# Patient Record
Sex: Female | Born: 1975 | Race: White | Hispanic: No | Marital: Married | Smoking: Never smoker
Health system: Southern US, Community
[De-identification: ages and names within clinical notes are randomized; demographics above are authoritative.]

## PROBLEM LIST (undated history)

## (undated) HISTORY — PX: CHOLECYSTECTOMY: SHX55

## (undated) HISTORY — PX: ABDOMINAL HYSTERECTOMY: SHX81

---

## 2013-06-26 ENCOUNTER — Other Ambulatory Visit: Payer: Self-pay | Admitting: Gastroenterology

## 2013-06-26 DIAGNOSIS — R109 Unspecified abdominal pain: Secondary | ICD-10-CM

## 2013-06-29 ENCOUNTER — Other Ambulatory Visit: Payer: Self-pay

## 2013-07-16 ENCOUNTER — Other Ambulatory Visit: Payer: Self-pay

## 2013-07-30 ENCOUNTER — Ambulatory Visit
Admission: RE | Admit: 2013-07-30 | Discharge: 2013-07-30 | Disposition: A | Discharge status: Home / Self Care | Payer: BC Managed Care – PPO | Source: Ambulatory Visit | Attending: Gastroenterology | Admitting: Gastroenterology

## 2013-07-30 DIAGNOSIS — R109 Unspecified abdominal pain: Secondary | ICD-10-CM

## 2015-10-30 IMAGING — US US ABDOMEN COMPLETE
1 series · 14 of 25 positions shown · non-contrast
Comparison: None.

CLINICAL DATA: Abdominal pain

EXAM:
ULTRASOUND ABDOMEN COMPLETE

[Series 1: us abdomen complete · 0.26mm/px · 14 of 60 slices shown]
[im 1/60]
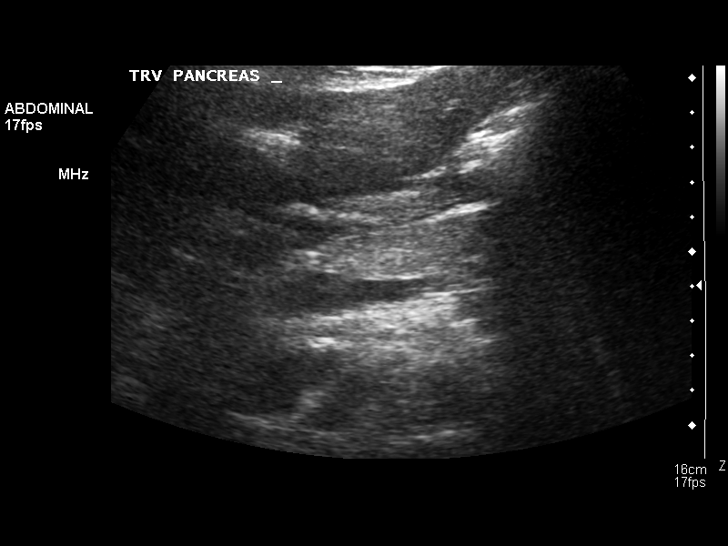
[im 5/60]
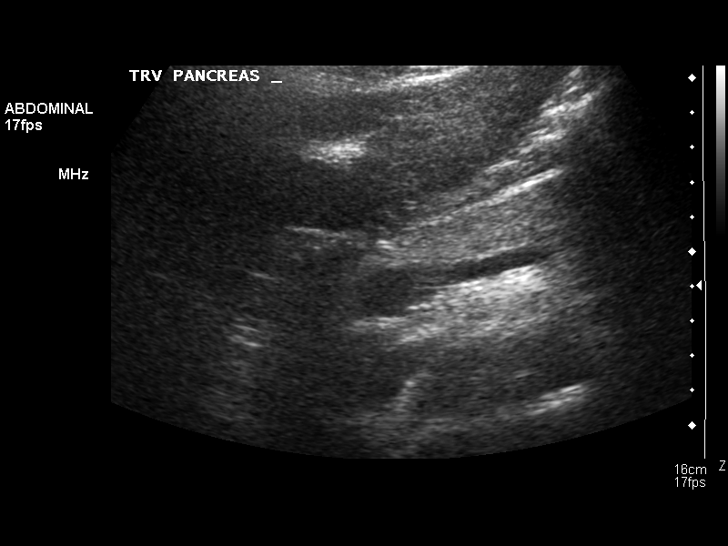
[im 10/60]
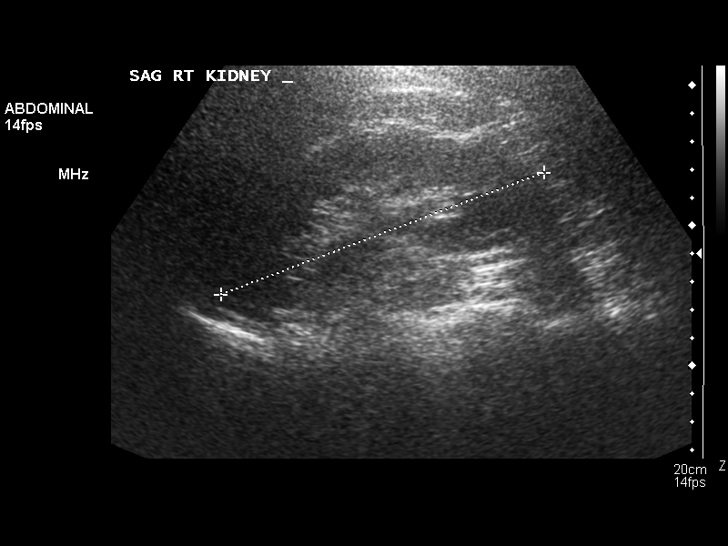
[im 15/60]
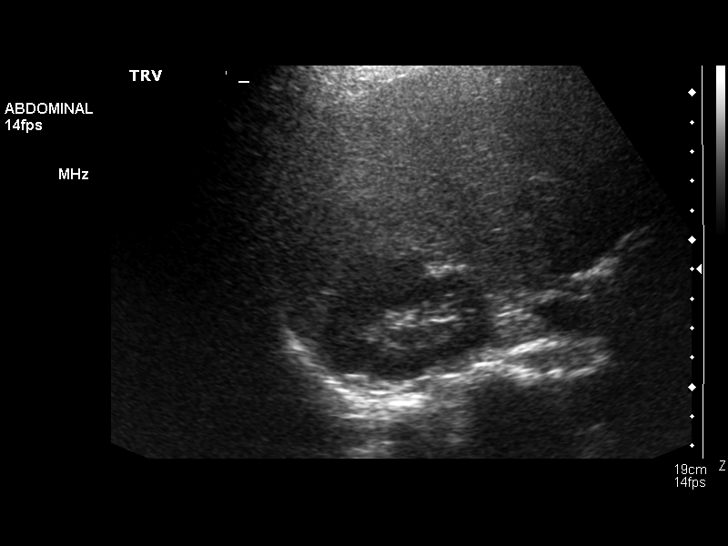
[im 20/60]
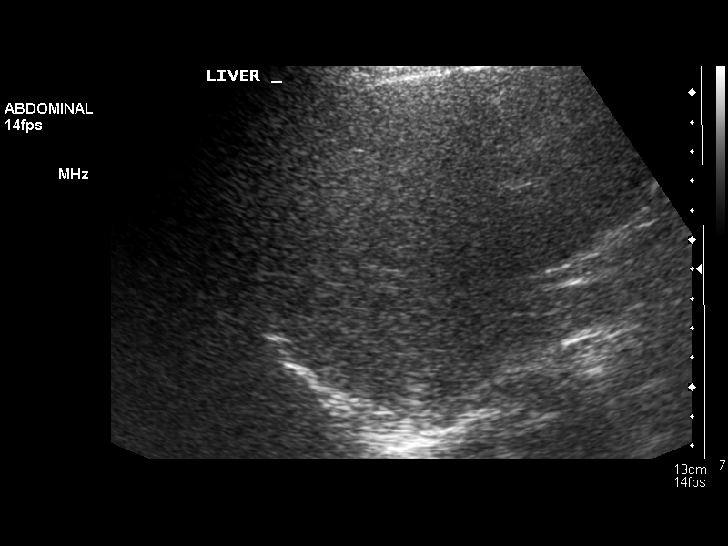
[im 23/60]
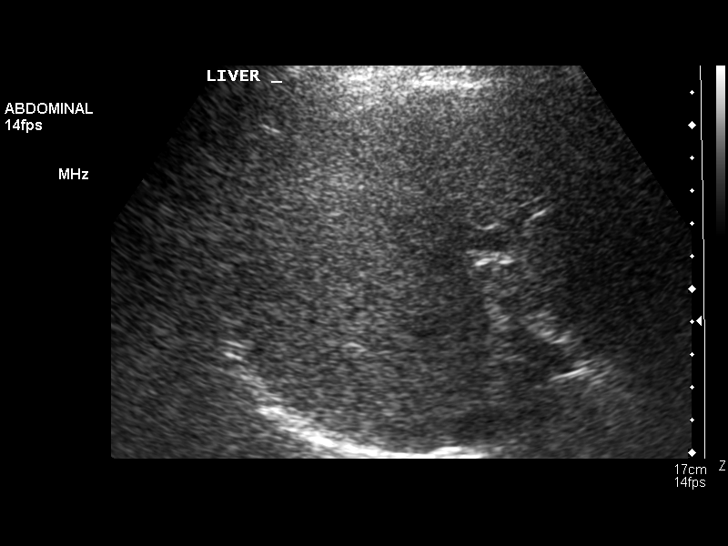
[im 28/60]
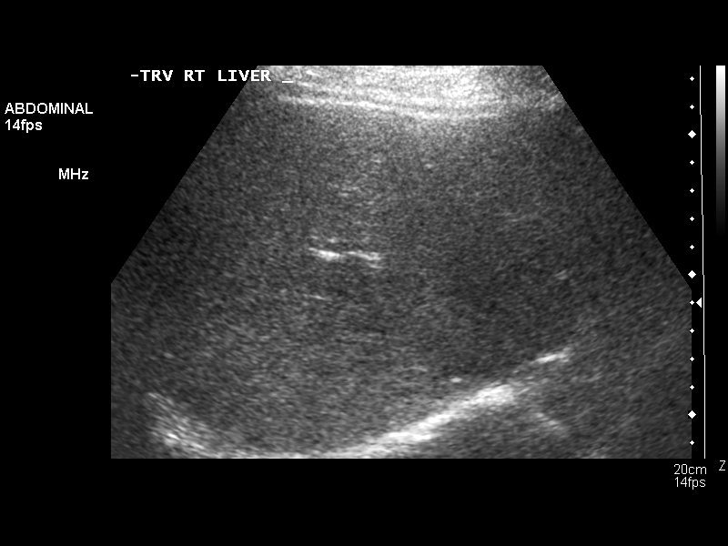
[im 32/60]
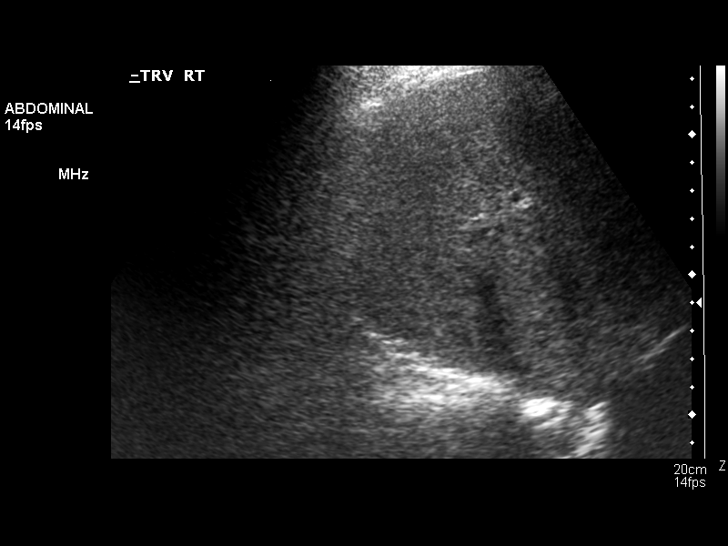
[im 37/60]
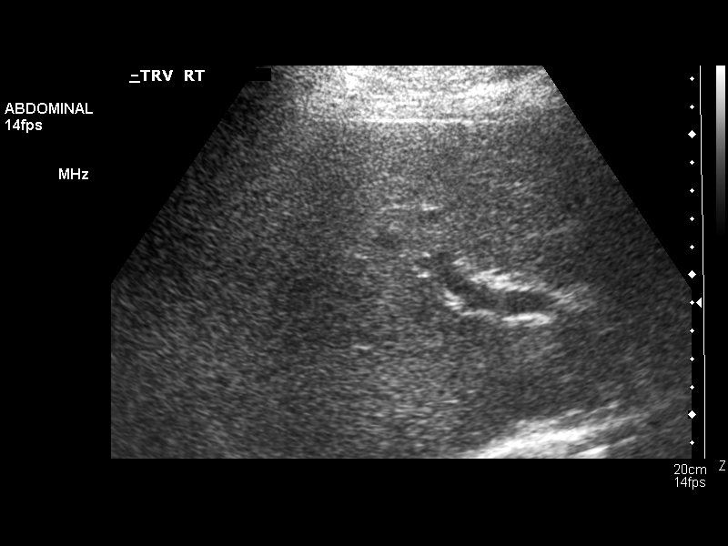
[im 40/60]
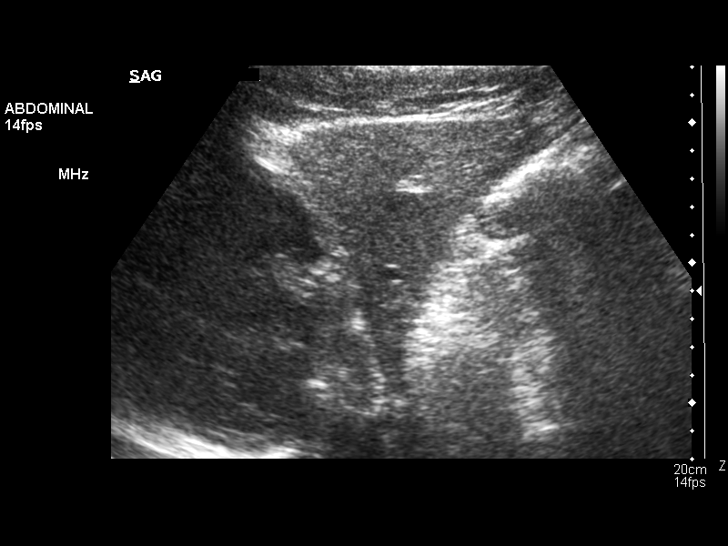
[im 45/60]
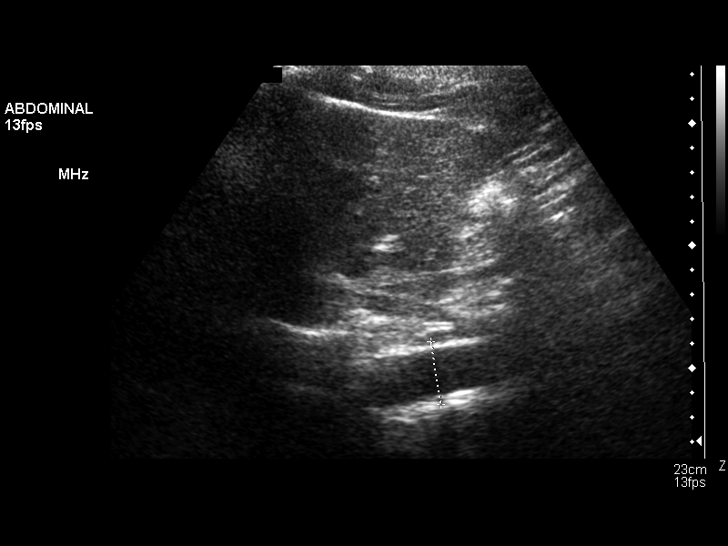
[im 50/60]
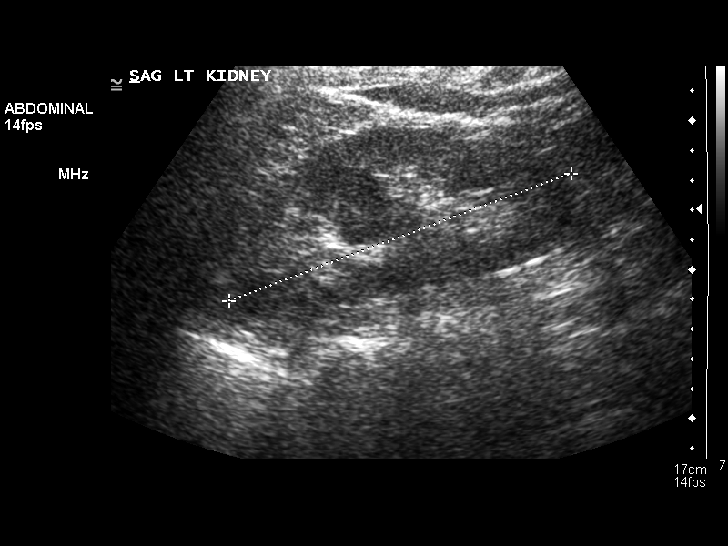
[im 55/60]
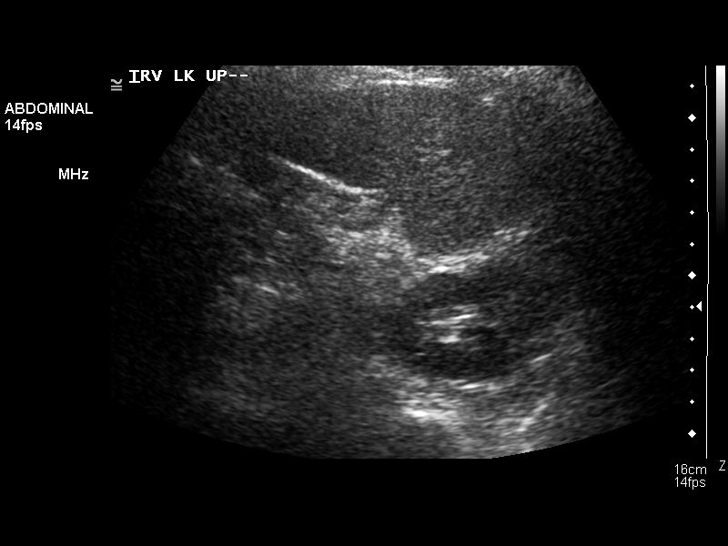
[im 60/60]
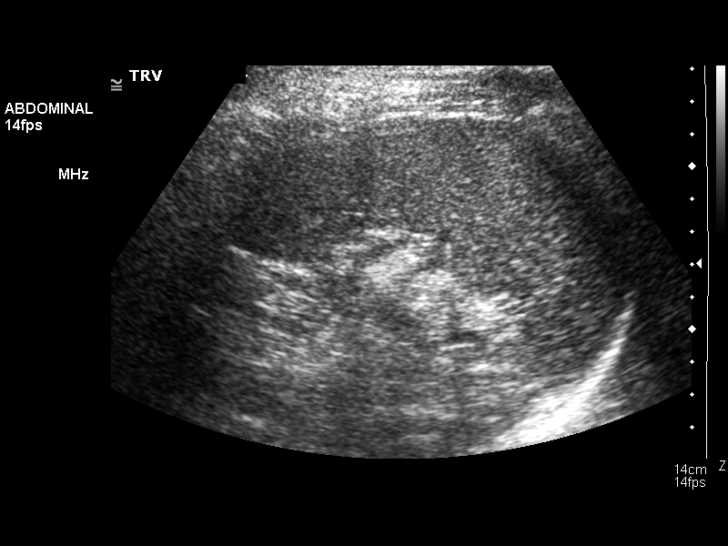

[14 of 25 positions shown; findings below may reference images not displayed]

FINDINGS: Gallbladder:

Surgically absent gallbladder

Common bile duct:

Diameter: 3.4 mm

Liver:

Echogenic liver consistent with fatty infiltration. No focal liver
mass lesion

IVC:

No abnormality visualized.

Pancreas:

Visualized portion unremarkable.

Spleen:

Size and appearance within normal limits.

Right Kidney:

Length: 12.3 cm. Echogenicity within normal limits. No mass or
hydronephrosis visualized.

Left Kidney:

Length: 12.3 cm. Echogenicity within normal limits. No mass or
hydronephrosis visualized.

Abdominal aorta:

No aneurysm visualized.

Other findings:

None.
IMPRESSION: Fatty infiltration of the liver. Surgically absent gallbladder. No
acute abnormality.

## 2016-04-08 ENCOUNTER — Institutional Professional Consult (permissible substitution): Payer: BC Managed Care – PPO | Admitting: Neurology

## 2020-05-08 ENCOUNTER — Other Ambulatory Visit: Payer: Self-pay

## 2020-05-08 ENCOUNTER — Encounter (HOSPITAL_BASED_OUTPATIENT_CLINIC_OR_DEPARTMENT_OTHER): Payer: Self-pay | Admitting: Emergency Medicine

## 2020-05-08 ENCOUNTER — Emergency Department (HOSPITAL_BASED_OUTPATIENT_CLINIC_OR_DEPARTMENT_OTHER): Payer: BC Managed Care – PPO

## 2020-05-08 ENCOUNTER — Emergency Department (HOSPITAL_BASED_OUTPATIENT_CLINIC_OR_DEPARTMENT_OTHER)
Admission: EM | Admit: 2020-05-08 | Discharge: 2020-05-09 | Disposition: A | Discharge status: Home / Self Care | Payer: BC Managed Care – PPO | Attending: Emergency Medicine | Admitting: Emergency Medicine

## 2020-05-08 DIAGNOSIS — K56600 Partial intestinal obstruction, unspecified as to cause: Secondary | ICD-10-CM

## 2020-05-08 DIAGNOSIS — R1010 Upper abdominal pain, unspecified: Secondary | ICD-10-CM | POA: Diagnosis present

## 2020-05-08 DIAGNOSIS — K56699 Other intestinal obstruction unspecified as to partial versus complete obstruction: Secondary | ICD-10-CM | POA: Diagnosis not present

## 2020-05-08 LAB — CBC
HCT: 42.4 % (ref 36.0–46.0)
Hemoglobin: 14 g/dL (ref 12.0–15.0)
MCH: 29.2 pg (ref 26.0–34.0)
MCHC: 33 g/dL (ref 30.0–36.0)
MCV: 88.3 fL (ref 80.0–100.0)
Platelets: 318 10*3/uL (ref 150–400)
RBC: 4.8 MIL/uL (ref 3.87–5.11)
RDW: 13 % (ref 11.5–15.5)
WBC: 14.6 10*3/uL — ABNORMAL HIGH (ref 4.0–10.5)
nRBC: 0 % (ref 0.0–0.2)

## 2020-05-08 LAB — COMPREHENSIVE METABOLIC PANEL
ALT: 22 U/L (ref 0–44)
AST: 22 U/L (ref 15–41)
Albumin: 4.3 g/dL (ref 3.5–5.0)
Alkaline Phosphatase: 64 U/L (ref 38–126)
Anion gap: 10 (ref 5–15)
BUN: 10 mg/dL (ref 6–20)
CO2: 25 mmol/L (ref 22–32)
Calcium: 9.5 mg/dL (ref 8.9–10.3)
Chloride: 104 mmol/L (ref 98–111)
Creatinine, Ser: 0.69 mg/dL (ref 0.44–1.00)
GFR, Estimated: >60 mL/min (ref >60)
Glucose, Bld: 115 mg/dL — ABNORMAL HIGH (ref 70–99)
Potassium: 3.7 mmol/L (ref 3.5–5.1)
Sodium: 139 mmol/L (ref 135–145)
Total Bilirubin: 1.2 mg/dL (ref 0.3–1.2)
Total Protein: 8 g/dL (ref 6.5–8.1)

## 2020-05-08 LAB — URINALYSIS, ROUTINE W REFLEX MICROSCOPIC
Bilirubin Urine: NEGATIVE
Glucose, UA: NEGATIVE mg/dL
Hgb urine dipstick: NEGATIVE
Ketones, ur: NEGATIVE mg/dL
Leukocytes,Ua: NEGATIVE
Nitrite: NEGATIVE
Protein, ur: NEGATIVE mg/dL
Specific Gravity, Urine: >1.03 — ABNORMAL HIGH (ref 1.005–1.030)
pH: 6.5 (ref 5.0–8.0)

## 2020-05-08 LAB — LIPASE, BLOOD: Lipase: 37 U/L (ref 11–51)

## 2020-05-08 LAB — LACTIC ACID, PLASMA: Lactic Acid, Venous: 0.9 mmol/L (ref 0.5–1.9)

## 2020-05-08 MED ORDER — MORPHINE SULFATE (PF) 4 MG/ML IV SOLN
4.0000 mg | Freq: Once | INTRAVENOUS | Status: DC
  Filled 2020-05-08: qty 1

## 2020-05-08 MED ORDER — FAMOTIDINE 20 MG PO TABS
40.0000 mg | ORAL_TABLET | Freq: Once | ORAL | Status: AC
  Administered 2020-05-08: 40 mg via ORAL
  Filled 2020-05-08: qty 2

## 2020-05-08 MED ORDER — ONDANSETRON 4 MG PO TBDP
4.0000 mg | ORAL_TABLET | Freq: Once | ORAL | Status: AC
  Administered 2020-05-08: 4 mg via ORAL
  Filled 2020-05-08: qty 1

## 2020-05-08 MED ORDER — ALUM & MAG HYDROXIDE-SIMETH 200-200-20 MG/5ML PO SUSP
30.0000 mL | Freq: Once | ORAL | Status: AC
  Administered 2020-05-08: 30 mL via ORAL
  Filled 2020-05-08: qty 30

## 2020-05-08 MED ORDER — LACTATED RINGERS IV BOLUS
1000.0000 mL | Freq: Once | INTRAVENOUS | Status: AC
  Administered 2020-05-08: 1000 mL via INTRAVENOUS

## 2020-05-08 MED ORDER — IOHEXOL 300 MG/ML  SOLN
100.0000 mL | Freq: Once | INTRAMUSCULAR | Status: AC | PRN
  Administered 2020-05-08: 100 mL via INTRAVENOUS

## 2020-05-08 NOTE — ED Notes (Signed)
Patient transported to CT 

## 2020-05-08 NOTE — ED Triage Notes (Addendum)
Upper abd pain for N/V for several days. Pt take wegovy for weight loss and states this could be a side effect.

## 2020-05-08 NOTE — ED Provider Notes (Addendum)
[facility] EMERGENCY DEPARTMENT Provider Note   CSN: [id number] Arrival date & time: 05/08/20  1720     History Chief Complaint  Patient presents with  . Abdominal Pain    Ashley Nixon is a 44 y.o. female.  HPI Patient reports she is taking Wegovy for weight loss.  She started developing abdominal pain yesterday.  She reports she has pain in her upper abdomen.  It feels like a "stomachache".  She reports that just seemed to get worse over the course of today.  She has now become very nauseated.  She reports earlier today the pain was nearly doubling her over.  She reports that slightly improved but still very uncomfortable.  No significant lower abdominal pain.  Patient denies she has not had diarrhea or pain or burning urgency with urination.  Patient reports she has had a cholecystectomy.  She is also had a hysterectomy.  Patient reports that she tried some Gas-X today without relief.  He does not typically take PPI/antacids.    History reviewed. No pertinent past medical history.  There are no problems to display for this patient.   Past Surgical History:  Procedure Laterality Date  . ABDOMINAL HYSTERECTOMY    . CESAREAN SECTION    . CHOLECYSTECTOMY       OB History   No obstetric history on file.     No family history on file.  Social History   Tobacco Use  . Smoking status: Never Smoker  . Smokeless tobacco: Never Used  Substance Use Topics  . Alcohol use: Not Currently    Home Medications Prior to Admission medications   Medication Sig Start Date End Date Taking? Authorizing Provider  famotidine (PEPCID) 20 MG tablet Take 1 tablet (20 mg total) by mouth 2 (two) times daily. 05/09/20   Molpus, Jonny Ruiz, MD  ondansetron (ZOFRAN ODT) 8 MG disintegrating tablet Take 1 tablet (8 mg total) by mouth every 8 (eight) hours as needed for nausea or vomiting. 05/09/20   Molpus, John, MD    Allergies    Sulfa antibiotics  Review of Systems   Review of  Systems 10 systems reviewed and negative except as per HPI Physical Exam Updated Vital Signs BP (!) 150/78   Pulse 74   Temp 98 F (36.7 C)   Resp 18   Ht 5\' 8"  (1.727 m)   Wt (!) 138.8 kg   SpO2 99%   BMI 46.53 kg/m   Physical Exam Constitutional:      Appearance: She is well-developed and well-nourished.     Comments: Patient is alert and nontoxic. Clinically well in appearance. No respiratory distress.  HENT:     Head: Normocephalic and atraumatic.     Mouth/Throat:     Pharynx: Oropharynx is clear.  Eyes:     Extraocular Movements: Extraocular movements intact and EOM normal.     Conjunctiva/sclera: Conjunctivae normal.  Cardiovascular:     Rate and Rhythm: Normal rate and regular rhythm.     Pulses: Intact distal pulses.     Heart sounds: Normal heart sounds.  Pulmonary:     Effort: Pulmonary effort is normal.     Breath sounds: Normal breath sounds.  Abdominal:     General: Bowel sounds are normal. There is no distension.     Palpations: Abdomen is soft.     Tenderness: There is abdominal tenderness.     Comments: Patient has mild tenderness in the epigastrium and left upper quadrant. No guarding. Some central  discomfort to palpation around the umbilicus. No hernia or umbilical mass. Lower abdomen nontender.  Musculoskeletal:        General: No edema. Normal range of motion.     Cervical back: Neck supple.  Skin:    General: Skin is warm, dry and intact.  Neurological:     Mental Status: She is alert and oriented to person, place, and time.     GCS: GCS eye subscore is 4. GCS verbal subscore is 5. GCS motor subscore is 6.     Coordination: Coordination normal.     Deep Tendon Reflexes: Strength normal.  Psychiatric:        Mood and Affect: Mood and affect normal.     ED Results / Procedures / Treatments   Labs (all labs ordered are listed, but only abnormal results are displayed) Labs Reviewed  COMPREHENSIVE METABOLIC PANEL - Abnormal; Notable for the  following components:      Result Value   Glucose, Bld 115 (*)    All other components within normal limits  CBC - Abnormal; Notable for the following components:   WBC 14.6 (*)    All other components within normal limits  URINALYSIS, ROUTINE W REFLEX MICROSCOPIC - Abnormal; Notable for the following components:   APPearance CLOUDY (*)    Specific Gravity, Urine >1.030 (*)    All other components within normal limits  CULTURE, BLOOD (ROUTINE X 2)  CULTURE, BLOOD (ROUTINE X 2)  LIPASE, BLOOD  LACTIC ACID, PLASMA    EKG None  Radiology CT Abdomen Pelvis W Contrast  Result Date: 05/08/2020 CLINICAL DATA:  Upper abdominal pain and nausea for several days. Weight loss. EXAM: CT ABDOMEN AND PELVIS WITH CONTRAST TECHNIQUE: Multidetector CT imaging of the abdomen and pelvis was performed using the standard protocol following bolus administration of intravenous contrast. CONTRAST:  OMNIPAQUE IOHEXOL 300 MG/ML  SOLN COMPARISON:  CT abdomen pelvis 04/01/2017 FINDINGS: Lower chest: No acute abnormality. Hepatobiliary: Redemonstration of a similar-appearing right hepatic lobe 2.7 x 3.2 cm hypodense lesion (2:6). Vague hypodensity along the false form ligament likely represents focal fatty focal tray shin. Otherwise no new focal liver abnormality is seen. Status post cholecystectomy. No biliary dilatation. Pancreas: No focal lesion. Normal pancreatic contour. No surrounding inflammatory changes. No main pancreatic ductal dilatation. Spleen: Normal in size without focal abnormality. Adrenals/Urinary Tract: No adrenal nodule bilaterally. Bilateral kidneys enhance symmetrically. No hydronephrosis. No hydroureter. The urinary bladder is unremarkable. Stomach/Bowel: Stomach is within normal limits. Couple loops of left upper and mid abdominal small bowel are minimally distended with fluid and measure up to 3.9 cm. Associated mild fecalization sign is noted. No pneumatosis. No definite transition point. No  small bowel wall thickening. No evidence of large bowel wall thickening or dilatation. Scattered colonic diverticulosis. The appendix not definitely visualized. Vascular/Lymphatic: No abdominal aorta or iliac aneurysm. No abdominal, pelvic, or inguinal lymphadenopathy. Reproductive: Status post hysterectomy. The right ovary/adnexal region are unremarkable. There is a 5.1 x 2.8 cm fluid density lesion that is associated with the left ovary/adnexal region. Other: No intraperitoneal free fluid. No intraperitoneal free gas. No organized fluid collection. Musculoskeletal: No abdominal wall hernia or abnormality No suspicious lytic or blastic osseous lesions. No acute displaced fracture. Multilevel degenerative changes of the spine. IMPRESSION: 1. Findings suggestive of a early/partial small bowel obstruction. 2. A 5.1 cm left ovarian cystic lesion. Because this lesion is not adequately characterized, prompt [place] is recommended for further evaluation. Note: This recommendation does not apply to premenarchal  patients and to those with increased risk (genetic, family history, elevated tumor markers or other high-risk factors) of ovarian cancer. Reference: JACR 2020 Feb; 17(2):248-254 3. Scattered colonic diverticulosis with no acute diverticulitis. Electronically Signed   By: Tish Frederickson M.D.   On: 05/08/2020 22:55   DG Abd Acute W/Chest  Result Date: 05/08/2020 CLINICAL DATA:  Upper abdominal pain with nausea and vomiting EXAM: DG ABDOMEN ACUTE WITH 1 VIEW CHEST COMPARISON:  CT 04/01/2017 FINDINGS: Single-view chest demonstrates no focal opacity or pleural effusion. Normal cardiomediastinal silhouette. Supine and upright views of the abdomen demonstrate no free air beneath the diaphragm. Multiple dilated loops of small bowel measuring up to 4.4 cm with fluid levels, though there is colon gas present, findings are concerning for a small bowel obstruction. IMPRESSION: Findings concerning for a small bowel obstruction.  Consider CT for further evaluation. Electronically Signed   By: Jasmine Pang M.D.   On: 05/08/2020 20:52    Procedures Procedures (including critical care time)  Medications Ordered in ED Medications  famotidine (PEPCID) tablet 40 mg (40 mg Oral Given 05/08/20 2051)  alum & mag hydroxide-simeth (MAALOX/MYLANTA) 200-200-20 MG/5ML suspension 30 mL (30 mLs Oral Given 05/08/20 2051)  ondansetron (ZOFRAN-ODT) disintegrating tablet 4 mg (4 mg Oral Given 05/08/20 2051)  lactated ringers bolus 1,000 mL (0 mLs Intravenous Stopped 05/08/20 2244)  iohexol (OMNIPAQUE) 300 MG/ML solution 100 mL (100 mLs Intravenous Contrast Given 05/08/20 2226)    ED Course  I have reviewed the triage vital signs and the nursing notes.  Pertinent labs & imaging results that were available during my care of the patient were reviewed by me and considered in my medical decision making (see chart for details).    MDM Rules/Calculators/A&P                          Patient presents with upper abdominal pain as outlined. She had recently started to [facility] for weight loss and thought this could be the reason for her new onset of abdominal pain. Patient is clinically well without surgical abdomen. Acute abdominal series suggested possible early small bowel obstruction. At that time patient had been given Pepcid, zofran  and GI cocktail. She reported feeling fairly significant improvement for that intervention. Due to findings on plain film x-ray, CT scan obtained. CT illustrated similar finding of early or possible partial SBO. I explained the finding to the patient and possible etiologies including adhesions from prior gallbladder surgery. I had recommended consultation for hospitalist admission for observation and initiation of conservative management for small bowel obstruction. Patient has strong preference to go home and return for recheck. I did advise that any surgery overnight was very unlikely unless there was a dramatic  change. Patient's abdomen at time of discharge is soft and only mildly reproducible discomfort. Shared decision making for clear liquids overnight and return for recheck the following day, with caveat that if pain was significantly worsening or suddenly worsening, vomiting, fever or worsening illness developed, patient was to return immediately to the emergency department. Final Clinical Impression(s) / ED Diagnoses Final diagnoses:  Partial small bowel obstruction (HCC)    Rx / DC Orders ED Discharge Orders         Ordered    ondansetron (ZOFRAN ODT) 8 MG disintegrating tablet  Every 8 hours PRN        05/09/20 0018    famotidine (PEPCID) 20 MG tablet  2 times daily  05/09/20 0018           Arby BarrettePfeiffer, Brain Honeycutt, MD 05/09/20 1417    Arby BarrettePfeiffer, Gavynn Duvall, MD 05/09/20 1418

## 2020-05-09 MED ORDER — FAMOTIDINE 20 MG PO TABS: 20.0000 mg | ORAL_TABLET | Freq: Two times a day (BID) | ORAL | 0 refills | Status: AC

## 2020-05-09 MED ORDER — ONDANSETRON 8 MG PO TBDP: 8.0000 mg | ORAL_TABLET | Freq: Three times a day (TID) | ORAL | 0 refills | Status: AC | PRN

## 2020-05-09 NOTE — Discharge Instructions (Signed)
Clear liquids only, recheck tomorrow. Return immediately if you suddenly increasing pain, vomiting, or other worsening symptoms.

## 2020-05-14 LAB — CULTURE, BLOOD (ROUTINE X 2)
Culture: NO GROWTH
Culture: NO GROWTH
Special Requests: ADEQUATE

## 2020-05-22 ENCOUNTER — Ambulatory Visit
Admission: RE | Admit: 2020-05-22 | Discharge: 2020-05-22 | Disposition: A | Discharge status: Home / Self Care | Payer: BC Managed Care – PPO | Source: Ambulatory Visit | Attending: Physician Assistant | Admitting: Physician Assistant

## 2020-05-22 ENCOUNTER — Other Ambulatory Visit: Payer: Self-pay | Admitting: Physician Assistant

## 2020-05-22 DIAGNOSIS — K56600 Partial intestinal obstruction, unspecified as to cause: Secondary | ICD-10-CM

## 2022-02-02 ENCOUNTER — Other Ambulatory Visit: Payer: Self-pay | Admitting: Internal Medicine

## 2022-02-02 DIAGNOSIS — R1011 Right upper quadrant pain: Secondary | ICD-10-CM

## 2022-02-03 ENCOUNTER — Ambulatory Visit (HOSPITAL_COMMUNITY)
Admission: RE | Admit: 2022-02-03 | Discharge: 2022-02-03 | Disposition: A | Discharge status: Home / Self Care | Payer: BC Managed Care – PPO | Source: Ambulatory Visit | Attending: Registered Nurse | Admitting: Registered Nurse

## 2022-02-03 ENCOUNTER — Other Ambulatory Visit: Payer: Self-pay | Admitting: Registered Nurse

## 2022-02-03 ENCOUNTER — Ambulatory Visit
Admission: RE | Admit: 2022-02-03 | Discharge: 2022-02-03 | Disposition: A | Discharge status: Home / Self Care | Payer: BC Managed Care – PPO | Source: Ambulatory Visit | Attending: Internal Medicine | Admitting: Internal Medicine

## 2022-02-03 ENCOUNTER — Other Ambulatory Visit (HOSPITAL_COMMUNITY): Payer: Self-pay | Admitting: Registered Nurse

## 2022-02-03 DIAGNOSIS — N2889 Other specified disorders of kidney and ureter: Secondary | ICD-10-CM | POA: Diagnosis not present

## 2022-02-03 DIAGNOSIS — R109 Unspecified abdominal pain: Secondary | ICD-10-CM | POA: Insufficient documentation

## 2022-02-03 DIAGNOSIS — D72829 Elevated white blood cell count, unspecified: Secondary | ICD-10-CM | POA: Diagnosis present

## 2022-02-03 DIAGNOSIS — R1011 Right upper quadrant pain: Secondary | ICD-10-CM

## 2022-02-03 MED ORDER — IOHEXOL 300 MG/ML  SOLN
100.0000 mL | Freq: Once | INTRAMUSCULAR | Status: AC | PRN
Start: 2022-02-03 — End: 2022-02-03
  Administered 2022-02-03: 100 mL via INTRAVENOUS

## 2022-08-08 IMAGING — CT CT ABD-PELV W/ CM
2 of 5 series · 16 of 46 positions shown, 18 images · IV contrast (Omnipaque)
Comparison: CT abdomen pelvis 04/01/2017

CLINICAL DATA: Upper abdominal pain and nausea for several days.
Weight loss.

EXAM:
CT ABDOMEN AND PELVIS WITH CONTRAST
TECHNIQUE: Multidetector CT imaging of the abdomen and pelvis was performed
using the standard protocol following bolus administration of
intravenous contrast.
CONTRAST:  100mL OMNIPAQUE IOHEXOL 300 MG/ML  SOLN

[Series 2: axial st · axial · 0.90mm/px · z∈[+271,+686]mm · 13 of 95 slices shown, 15 images]
[im 6/95  soft-tissue]
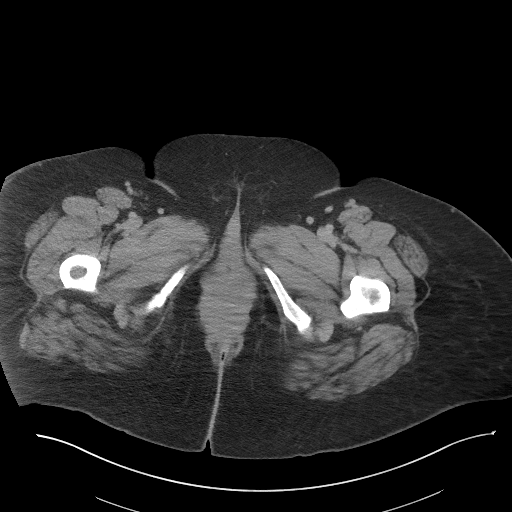
[im 6/95  bone]
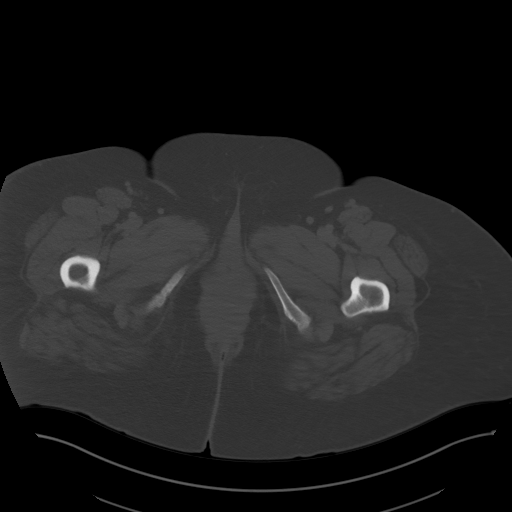
[im 11/95  soft-tissue]
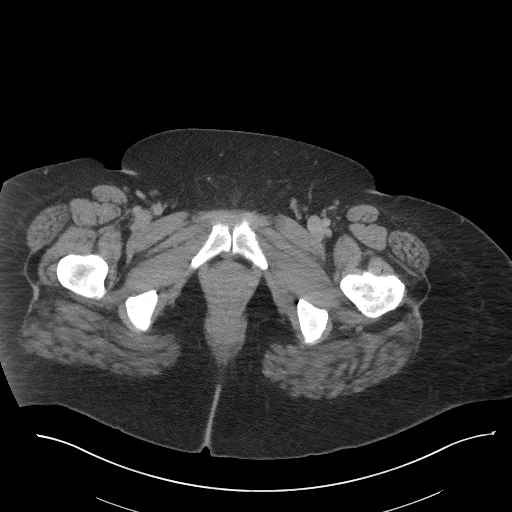
[im 21/95  soft-tissue]
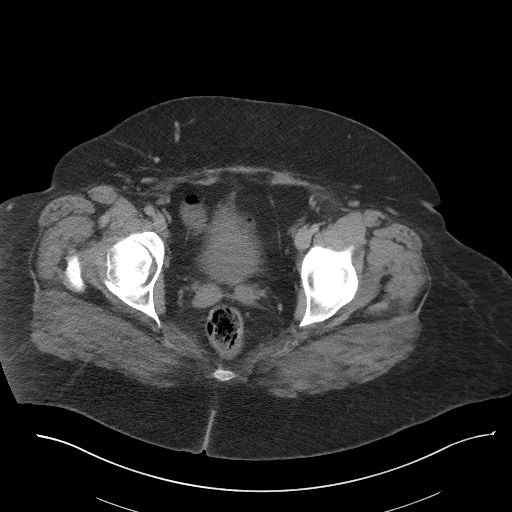
[im 27/95  soft-tissue]
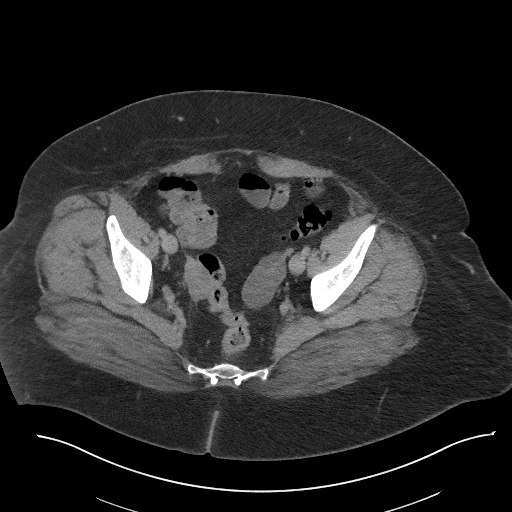
[im 32/95  soft-tissue]
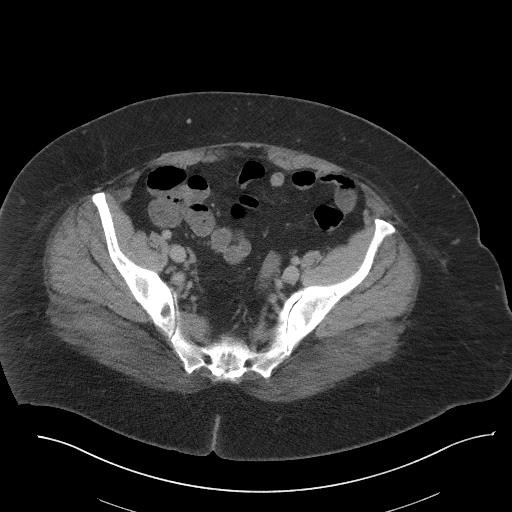
[im 42/95  soft-tissue]
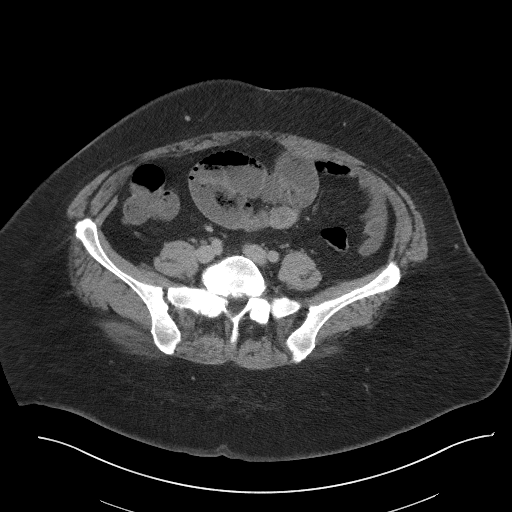
[im 48/95  soft-tissue]
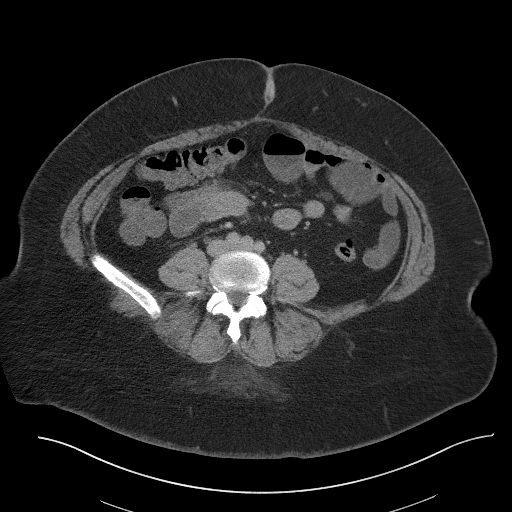
[im 53/95  soft-tissue]
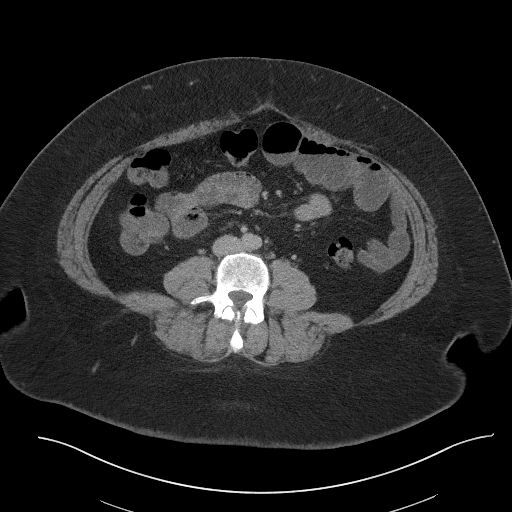
[im 63/95  soft-tissue]
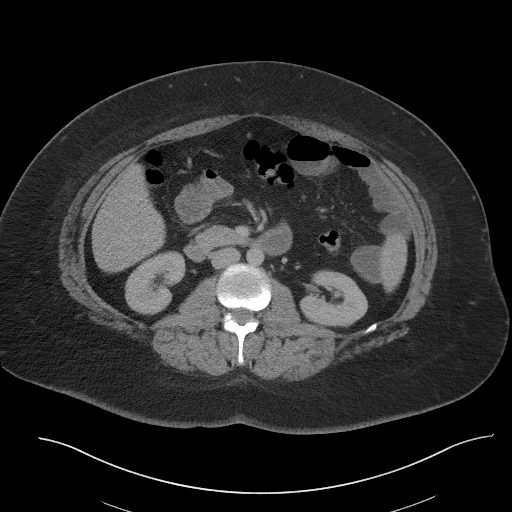
[im 63/95  bone]
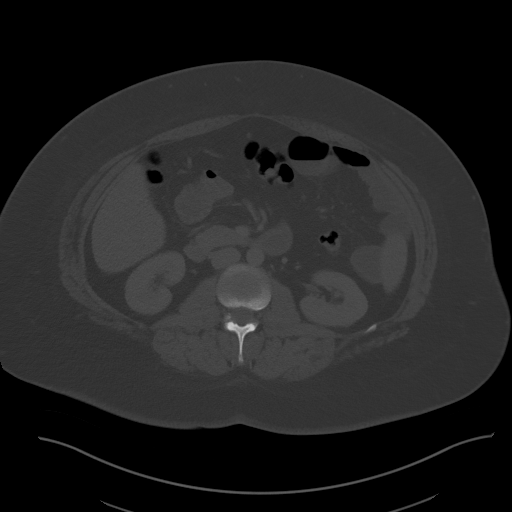
[im 68/95  soft-tissue]
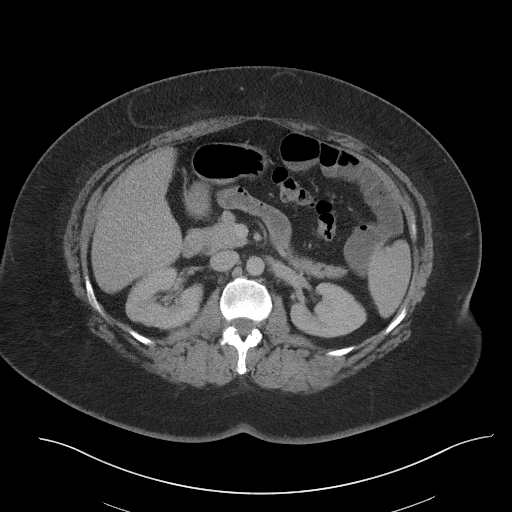
[im 74/95  soft-tissue]
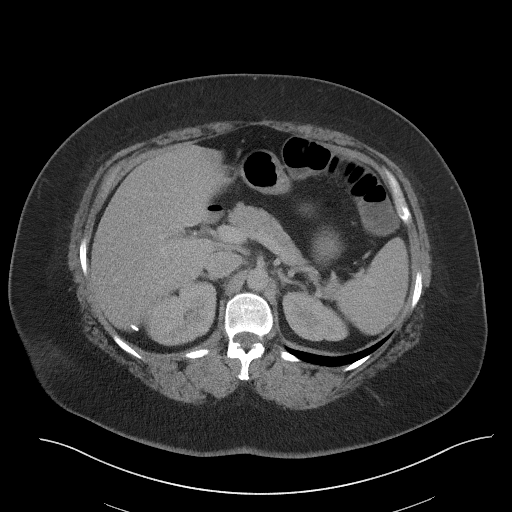
[im 84/95  soft-tissue]
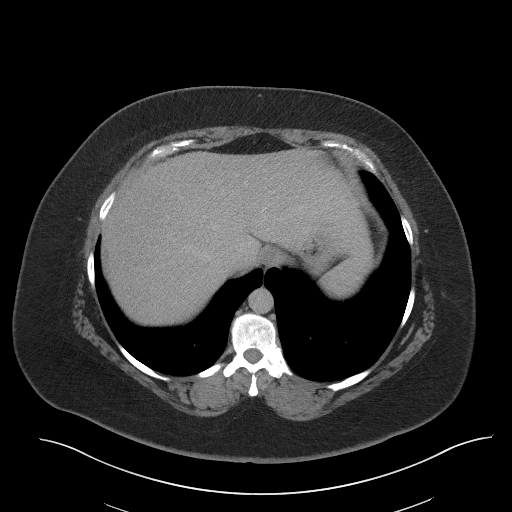
[im 89/95  soft-tissue]
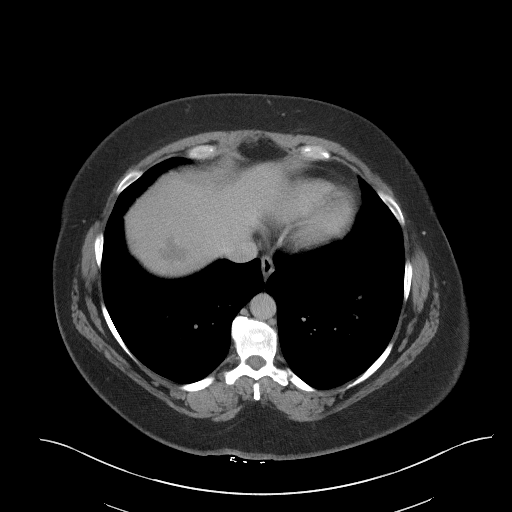

[Series 5: coronal st · coronal · 0.93mm/px · 3 of 107 slices shown]
[im 36/107  soft-tissue]
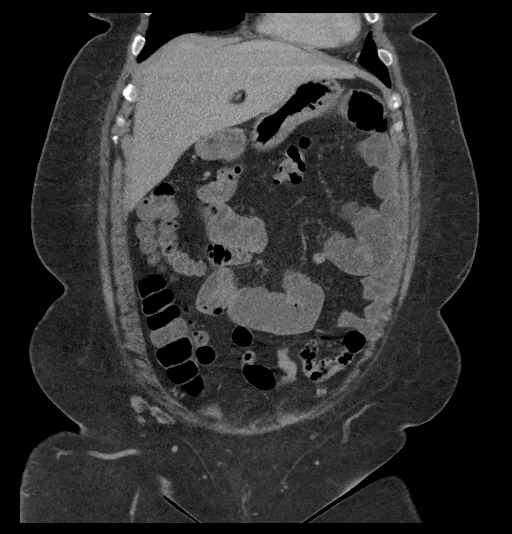
[im 48/107  soft-tissue]
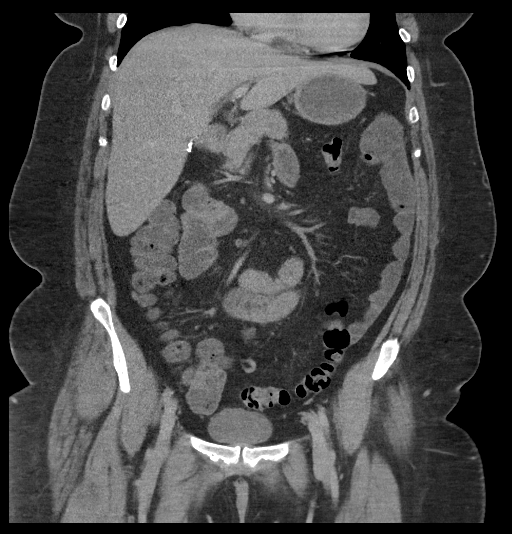
[im 59/107  soft-tissue]
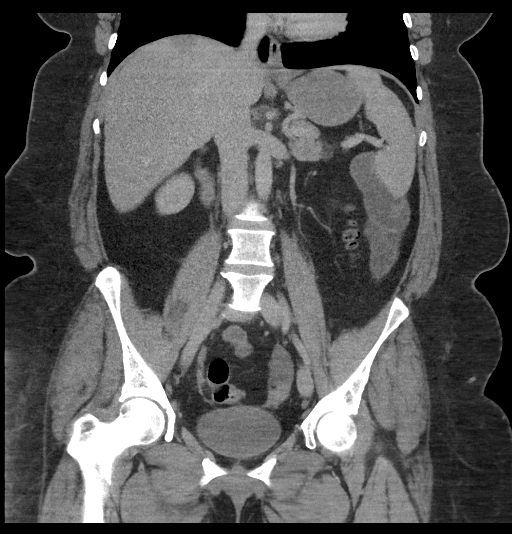

[16 of 46 positions shown; findings below may reference images not displayed]

FINDINGS: Lower chest: No acute abnormality.

Hepatobiliary: Redemonstration of a similar-appearing right hepatic
lobe 2.7 x 3.2 cm hypodense lesion ([DATE]). Vague hypodensity along
the false form ligament likely represents focal fatty focal tray
shin. Otherwise no new focal liver abnormality is seen. Status post
cholecystectomy. No biliary dilatation.

Pancreas: No focal lesion. Normal pancreatic contour. No surrounding
inflammatory changes. No main pancreatic ductal dilatation.

Spleen: Normal in size without focal abnormality.

Adrenals/Urinary Tract: No adrenal nodule bilaterally. Bilateral
kidneys enhance symmetrically. No hydronephrosis. No hydroureter.
The urinary bladder is unremarkable.

Stomach/Bowel: Stomach is within normal limits. Couple loops of left
upper and mid abdominal small bowel are minimally distended with
fluid and measure up to 3.9 cm. Associated mild fecalization sign is
noted. No pneumatosis. No definite transition point. No small bowel
wall thickening. No evidence of large bowel wall thickening or
dilatation. Scattered colonic diverticulosis. The appendix not
definitely visualized.

Vascular/Lymphatic: No abdominal aorta or iliac aneurysm. No
abdominal, pelvic, or inguinal lymphadenopathy.

Reproductive: Status post hysterectomy. The right ovary/adnexal
region are unremarkable. There is a 5.1 x 2.8 cm fluid density
lesion that is associated with the left ovary/adnexal region.

Other: No intraperitoneal free fluid. No intraperitoneal free gas.
No organized fluid collection.

Musculoskeletal:

No abdominal wall hernia or abnormality

No suspicious lytic or blastic osseous lesions. No acute displaced
fracture. Multilevel degenerative changes of the spine.
IMPRESSION: 1. Findings suggestive of a early/partial small bowel obstruction.
2. A 5.1 cm left ovarian cystic lesion. Because this lesion is not
adequately characterized, prompt US is recommended for further
evaluation. Note: This recommendation does not apply to premenarchal
patients and to those with increased risk (genetic, family history,
elevated tumor markers or other high-risk factors) of ovarian
cancer. Reference: JACR [DATE]):248-254
3. Scattered colonic diverticulosis with no acute diverticulitis.

## 2022-08-22 IMAGING — CR DG ABDOMEN 2V
3 series · 3 of 3 positions shown · non-contrast
Comparison: CT 03/08/2020

CLINICAL DATA: Small-bowel obstruction, recheck

EXAM:
ABDOMEN - 2 VIEW

[w abdomen upright]
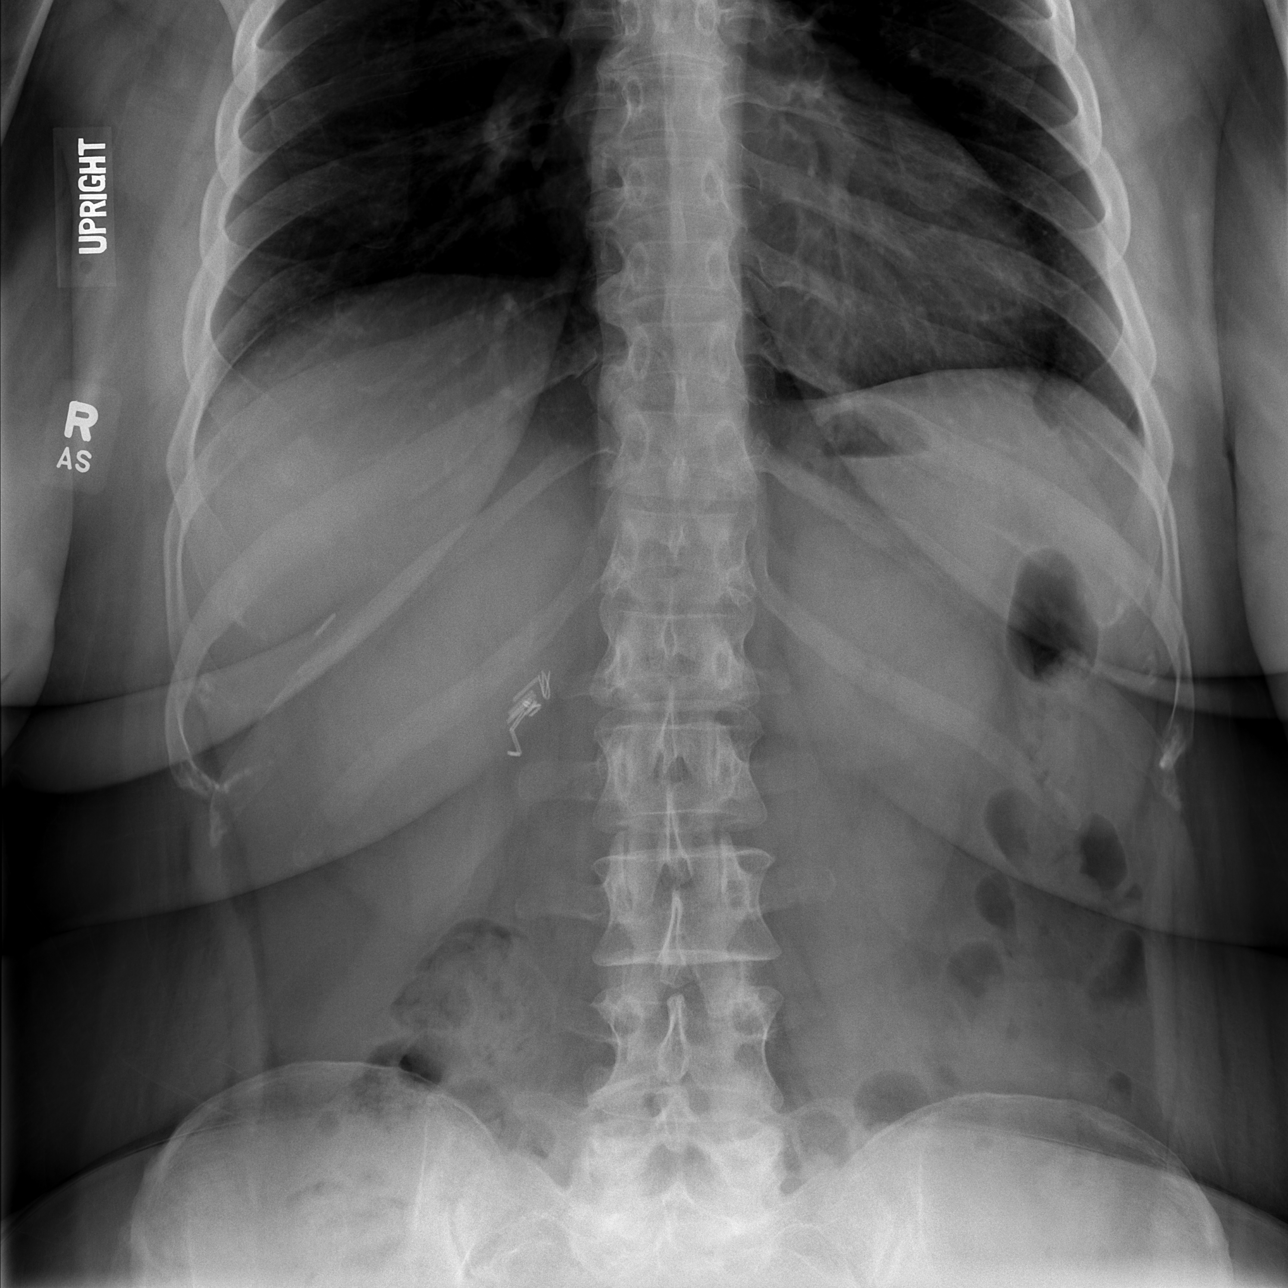

[t abdomen supine *]
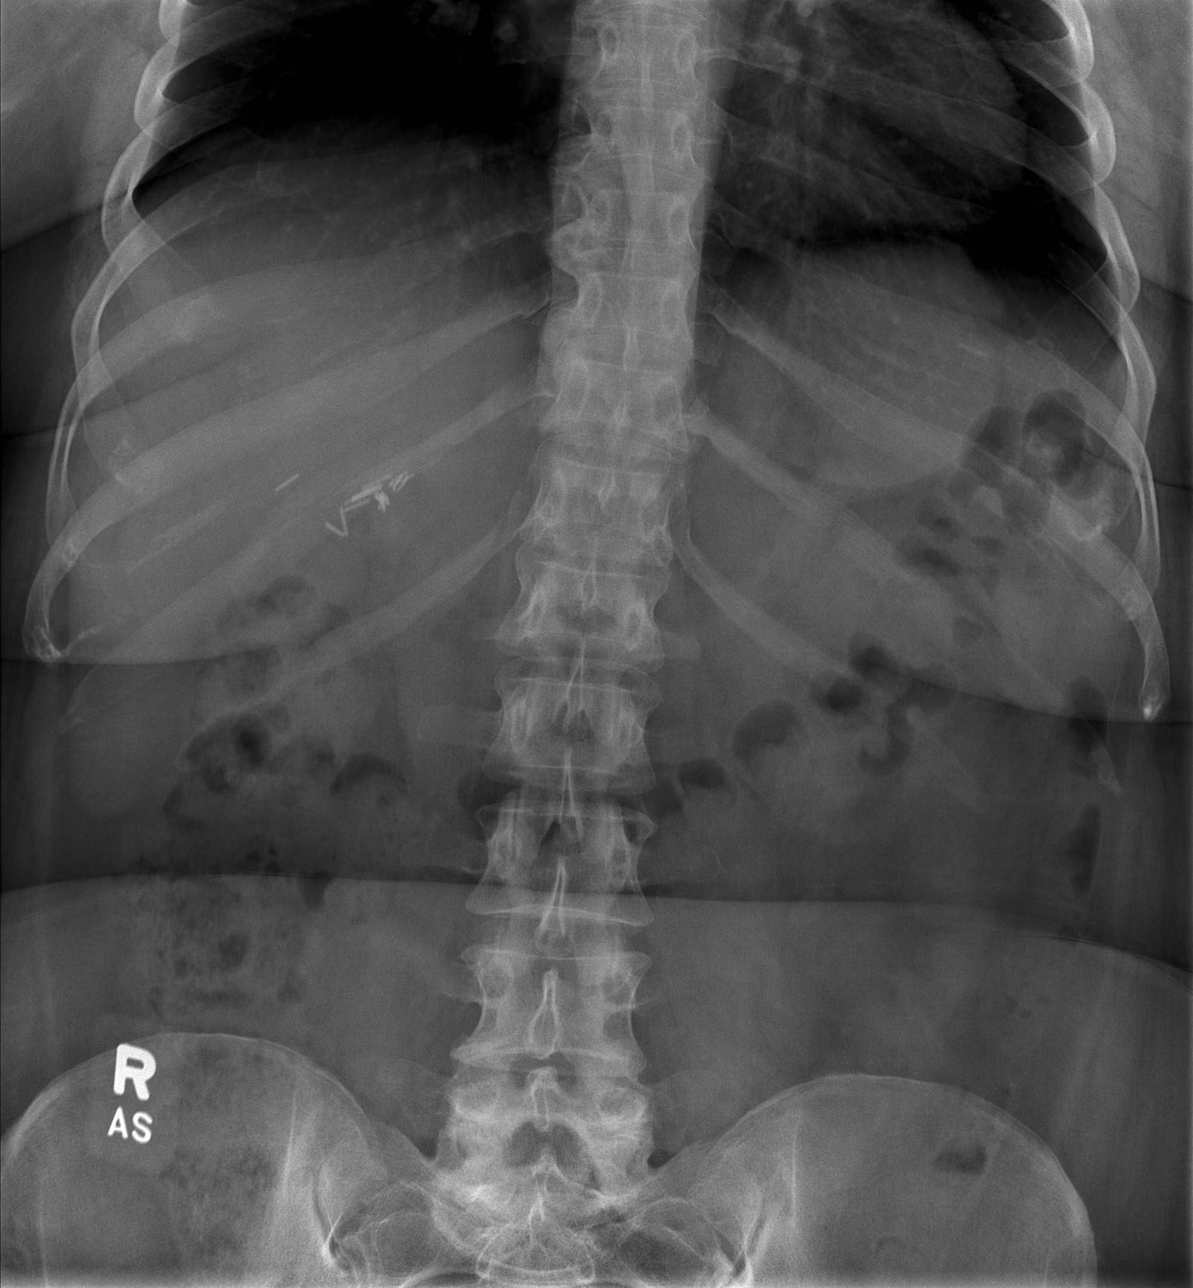

[t abdomen supine]
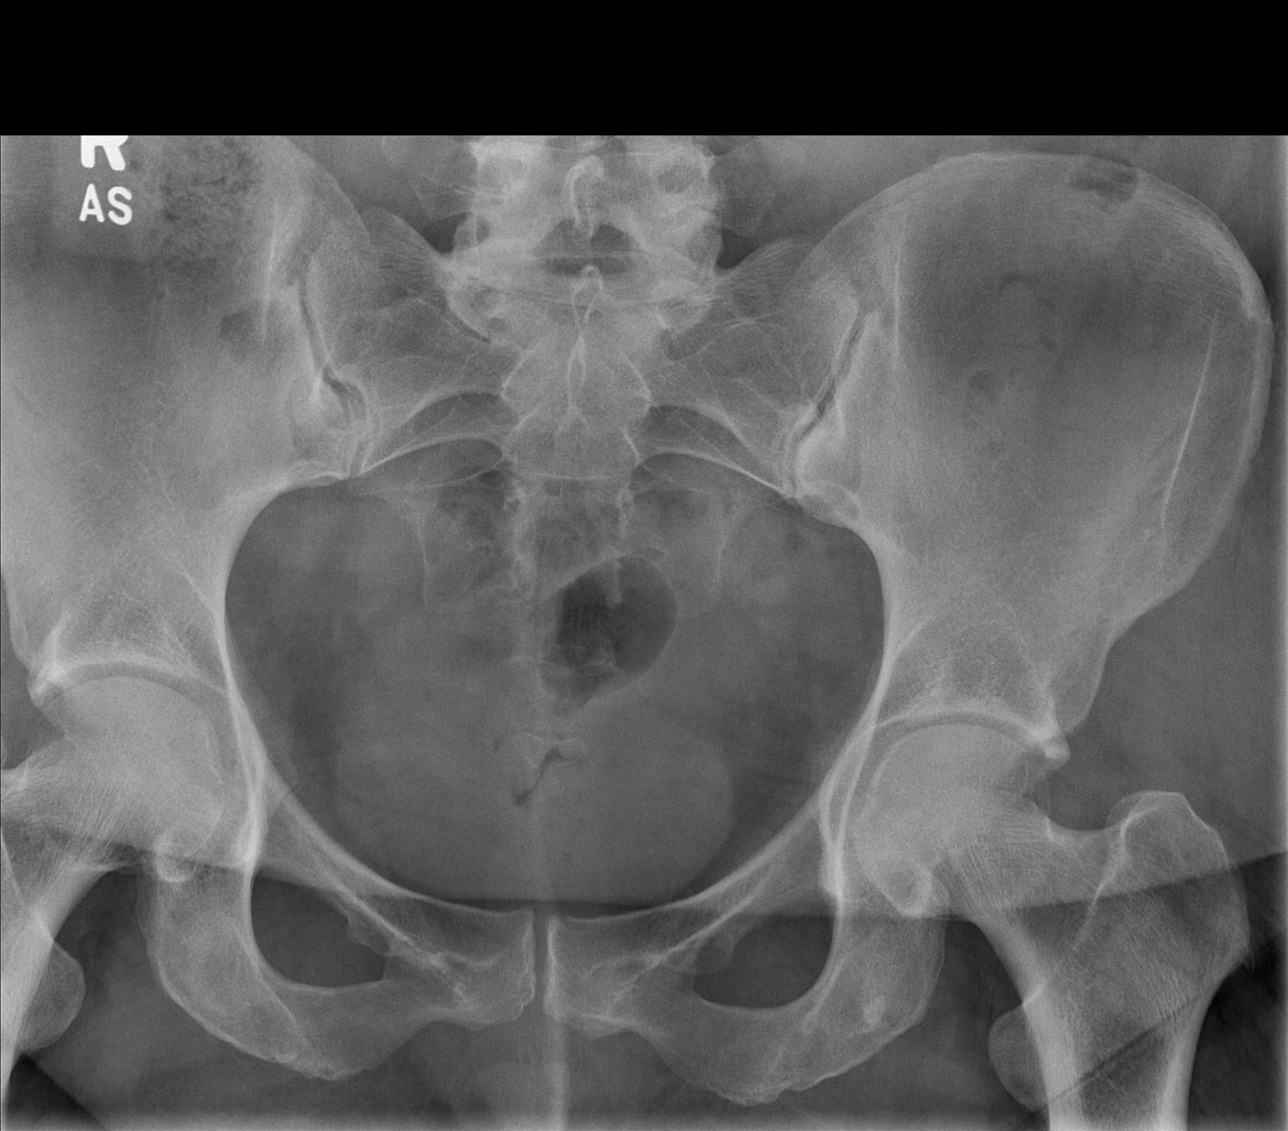

[3 of 3 positions shown; findings below may reference images not displayed]

FINDINGS: No residual distension of small bowel loops or other features to
suggest a persistent small bowel obstruction. Moderate colonic stool
burden without evidence of impaction. Surgical clips in the right
upper quadrant. No subdiaphragmatic free air. No suspicious
abdominal calcifications. Lung bases and cardiomediastinal contours
are unremarkable. Osseous structures and remaining soft tissues are
unremarkable.
IMPRESSION: No residual distension of small bowel loops or other features to
suggest a persistent small bowel obstruction.

Moderate colonic stool burden without evidence of impaction.

## 2023-10-28 ENCOUNTER — Other Ambulatory Visit: Payer: Self-pay | Admitting: Registered Nurse

## 2023-10-28 DIAGNOSIS — N83209 Unspecified ovarian cyst, unspecified side: Secondary | ICD-10-CM
